# Patient Record
Sex: Male | Born: 1985 | Race: White | Hispanic: No | Marital: Single | Smoking: Current every day smoker
Health system: Southern US, Community
[De-identification: ages and names within clinical notes are randomized; demographics above are authoritative.]

---

## 2013-06-08 ENCOUNTER — Emergency Department (HOSPITAL_COMMUNITY)
Admission: EM | Admit: 2013-06-08 | Discharge: 2013-06-08 | Disposition: A | Discharge status: Home / Self Care | Payer: BC Managed Care – PPO | Attending: Emergency Medicine | Admitting: Emergency Medicine

## 2013-06-08 ENCOUNTER — Emergency Department (HOSPITAL_COMMUNITY): Payer: BC Managed Care – PPO

## 2013-06-08 ENCOUNTER — Encounter (HOSPITAL_COMMUNITY): Payer: Self-pay | Admitting: Emergency Medicine

## 2013-06-08 DIAGNOSIS — Y929 Unspecified place or not applicable: Secondary | ICD-10-CM | POA: Insufficient documentation

## 2013-06-08 DIAGNOSIS — F17200 Nicotine dependence, unspecified, uncomplicated: Secondary | ICD-10-CM | POA: Insufficient documentation

## 2013-06-08 DIAGNOSIS — S5011XA Contusion of right forearm, initial encounter: Secondary | ICD-10-CM

## 2013-06-08 DIAGNOSIS — IMO0002 Reserved for concepts with insufficient information to code with codable children: Secondary | ICD-10-CM | POA: Insufficient documentation

## 2013-06-08 DIAGNOSIS — S62609A Fracture of unspecified phalanx of unspecified finger, initial encounter for closed fracture: Secondary | ICD-10-CM

## 2013-06-08 DIAGNOSIS — S5010XA Contusion of unspecified forearm, initial encounter: Secondary | ICD-10-CM | POA: Insufficient documentation

## 2013-06-08 DIAGNOSIS — Y9389 Activity, other specified: Secondary | ICD-10-CM | POA: Insufficient documentation

## 2013-06-08 MED ORDER — IBUPROFEN 800 MG PO TABS: 800.0000 mg | ORAL_TABLET | Freq: Three times a day (TID) | ORAL | Status: AC
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
## 2013-06-08 NOTE — Discharge Instructions (Signed)
Cryotherapy °Cryotherapy means treatment with cold. Ice or gel packs can be used to reduce both pain and swelling. Ice is the most helpful within the first 24 to 48 hours after an injury or flareup from overusing a muscle or joint. Sprains, strains, spasms, burning pain, shooting pain, and aches can all be eased with ice. Ice can also be used when recovering from surgery. Ice is effective, has very few side effects, and is safe for most people to use. °PRECAUTIONS  °Ice is not a safe treatment option for people with: °· Raynaud's phenomenon. This is a condition affecting small blood vessels in the extremities. Exposure to cold may cause your problems to return. °· Cold hypersensitivity. There are many forms of cold hypersensitivity, including: °· Cold urticaria. Red, itchy hives appear on the skin when the tissues begin to warm after being iced. °· Cold erythema. This is a red, itchy rash caused by exposure to cold. °· Cold hemoglobinuria. Red blood cells break down when the tissues begin to warm after being iced. The hemoglobin that carry oxygen are passed into the urine because they cannot combine with blood proteins fast enough. °· Numbness or altered sensitivity in the area being iced. °If you have any of the following conditions, do not use ice until you have discussed cryotherapy with your caregiver: °· Heart conditions, such as arrhythmia, angina, or chronic heart disease. °· High blood pressure. °· Healing wounds or open skin in the area being iced. °· Current infections. °· Rheumatoid arthritis. °· Poor circulation. °· Diabetes. °Ice slows the blood flow in the region it is applied. This is beneficial when trying to stop inflamed tissues from spreading irritating chemicals to surrounding tissues. However, if you expose your skin to cold temperatures for too long or without the proper protection, you can damage your skin or nerves. Watch for signs of skin damage due to cold. °HOME CARE INSTRUCTIONS °Follow  these tips to use ice and cold packs safely. °· Place a dry or damp towel between the ice and skin. A damp towel will cool the skin more quickly, so you may need to shorten the time that the ice is used. °· For a more rapid response, add gentle compression to the ice. °· Ice for no more than 10 to 20 minutes at a time. The bonier the area you are icing, the less time it will take to get the benefits of ice. °· Check your skin after 5 minutes to make sure there are no signs of a poor response to cold or skin damage. °· Rest 20 minutes or more in between uses. °· Once your skin is numb, you can end your treatment. You can test numbness by very lightly touching your skin. The touch should be so light that you do not see the skin dimple from the pressure of your fingertip. When using ice, most people will feel these normal sensations in this order: cold, burning, aching, and numbness. °· Do not use ice on someone who cannot communicate their responses to pain, such as small children or people with dementia. °HOW TO MAKE AN ICE PACK °Ice packs are the most common way to use ice therapy. Other methods include ice massage, ice baths, and cryo-sprays. Muscle creams that cause a cold, tingly feeling do not offer the same benefits that ice offers and should not be used as a substitute unless recommended by your caregiver. °To make an ice pack, do one of the following: °· Place crushed ice or   a bag of frozen vegetables in a sealable plastic bag. Squeeze out the excess air. Place this bag inside another plastic bag. Slide the bag into a pillowcase or place a damp towel between your skin and the bag.  Mix 3 parts water with 1 part rubbing alcohol. Freeze the mixture in a sealable plastic bag. When you remove the mixture from the freezer, it will be slushy. Squeeze out the excess air. Place this bag inside another plastic bag. Slide the bag into a pillowcase or place a damp towel between your skin and the bag. SEEK MEDICAL  CARE IF:  You develop white spots on your skin. This may give the skin a blotchy (mottled) appearance.  Your skin turns blue or pale.  Your skin becomes waxy or hard.  Your swelling gets worse. MAKE SURE YOU:   Understand these instructions.  Will watch your condition.  Will get help right away if you are not doing well or get worse. Document Released: 10/17/2010 Document Revised: 05/15/2011 Document Reviewed: 10/17/2010 [facility] Patient Information 2014 [employer], [place]. Finger Fracture Fractures of fingers are breaks in the bones of the fingers. There are many types of fractures. There are different ways of treating these fractures. Your health care provider will discuss the best way to treat your fracture. CAUSES Traumatic injury is the main cause of broken fingers. These include:  Injuries while playing sports.  Workplace injuries.  Falls. RISK FACTORS Activities that can increase your risk of finger fractures include:  Sports.  Workplace activities that involve machinery.  A condition called osteoporosis, which can make your bones less dense and cause them to fracture more easily. SIGNS AND SYMPTOMS The main symptoms of a broken finger are pain and swelling within 15 minutes after the injury. Other symptoms include:  Bruising of your finger.  Stiffness of your finger.  Numbness of your finger.  Exposed bones (compound fracture) if the fracture is severe. DIAGNOSIS  The best way to diagnose a broken bone is with X-ray imaging. Additionally, your health care provider will use this X-ray image to evaluate the position of the broken finger bones.  TREATMENT  Finger fractures can be treated with:   Nonreduction This means the bones are in place. The finger is splinted without changing the positions of the bone pieces. The splint is usually left on for about a week to 10 days. This will depend on your fracture and what your health care provider thinks.  Closed  reduction The bones are put back into position without using surgery. The finger is then splinted.  Open reduction and internal fixation The fracture site is opened. Then the bone pieces are fixed into place with pins or some type of hardware. This is seldom required. It depends on the severity of the fracture. HOME CARE INSTRUCTIONS   Follow your health care provider's instructions regarding activities, exercises, and physical therapy.  Only take over-the-counter or prescription medicines for pain, discomfort, or fever as directed by your health care provider. SEEK MEDICAL CARE IF: You have pain or swelling that limits the motion or use of your fingers. SEEK IMMEDIATE MEDICAL CARE IF:  Your finger becomes numb. MAKE SURE YOU:   Understand these instructions.  Will watch your condition.  Will get help right away if you are not doing well or get worse. Document Released: 06/04/2000 Document Revised: 12/11/2012 Document Reviewed: 10/02/2012 [facility] Patient Information 2014 [place], [place].
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
## 2013-06-08 NOTE — ED Notes (Signed)
Called ortho tech for splint 

## 2013-06-08 NOTE — Progress Notes (Signed)
Orthopedic Tech Progress Note Patient Details:  Paul Castillo 03/26/1985 [id number]  Ortho Devices Type of Ortho Device: Finger splint Ortho Device/Splint Location: RUE Ortho Device/Splint Interventions: Ordered;Application   Jennye MoccasinHughes, Keyonta Barradas Craig 06/08/2013, 8:04 PM
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
## 2013-06-08 NOTE — ED Notes (Signed)
Patient transported to X-ray 

## 2013-06-10 NOTE — ED Provider Notes (Signed)
Medical screening examination/treatment/procedure(s) were performed by non-physician practitioner and as supervising physician I was immediately available for consultation/collaboration.   Candyce ChurnJohn David Suhan Paci III, MD 06/10/13 [phone]

## 2014-11-18 IMAGING — CR DG HAND COMPLETE 3+V*R*
3 series · 3 of 3 positions shown · non-contrast
Comparison: None.

CLINICAL DATA: Trauma.

EXAM:
RIGHT HAND - COMPLETE 3+ VIEW

[x hand pa right]
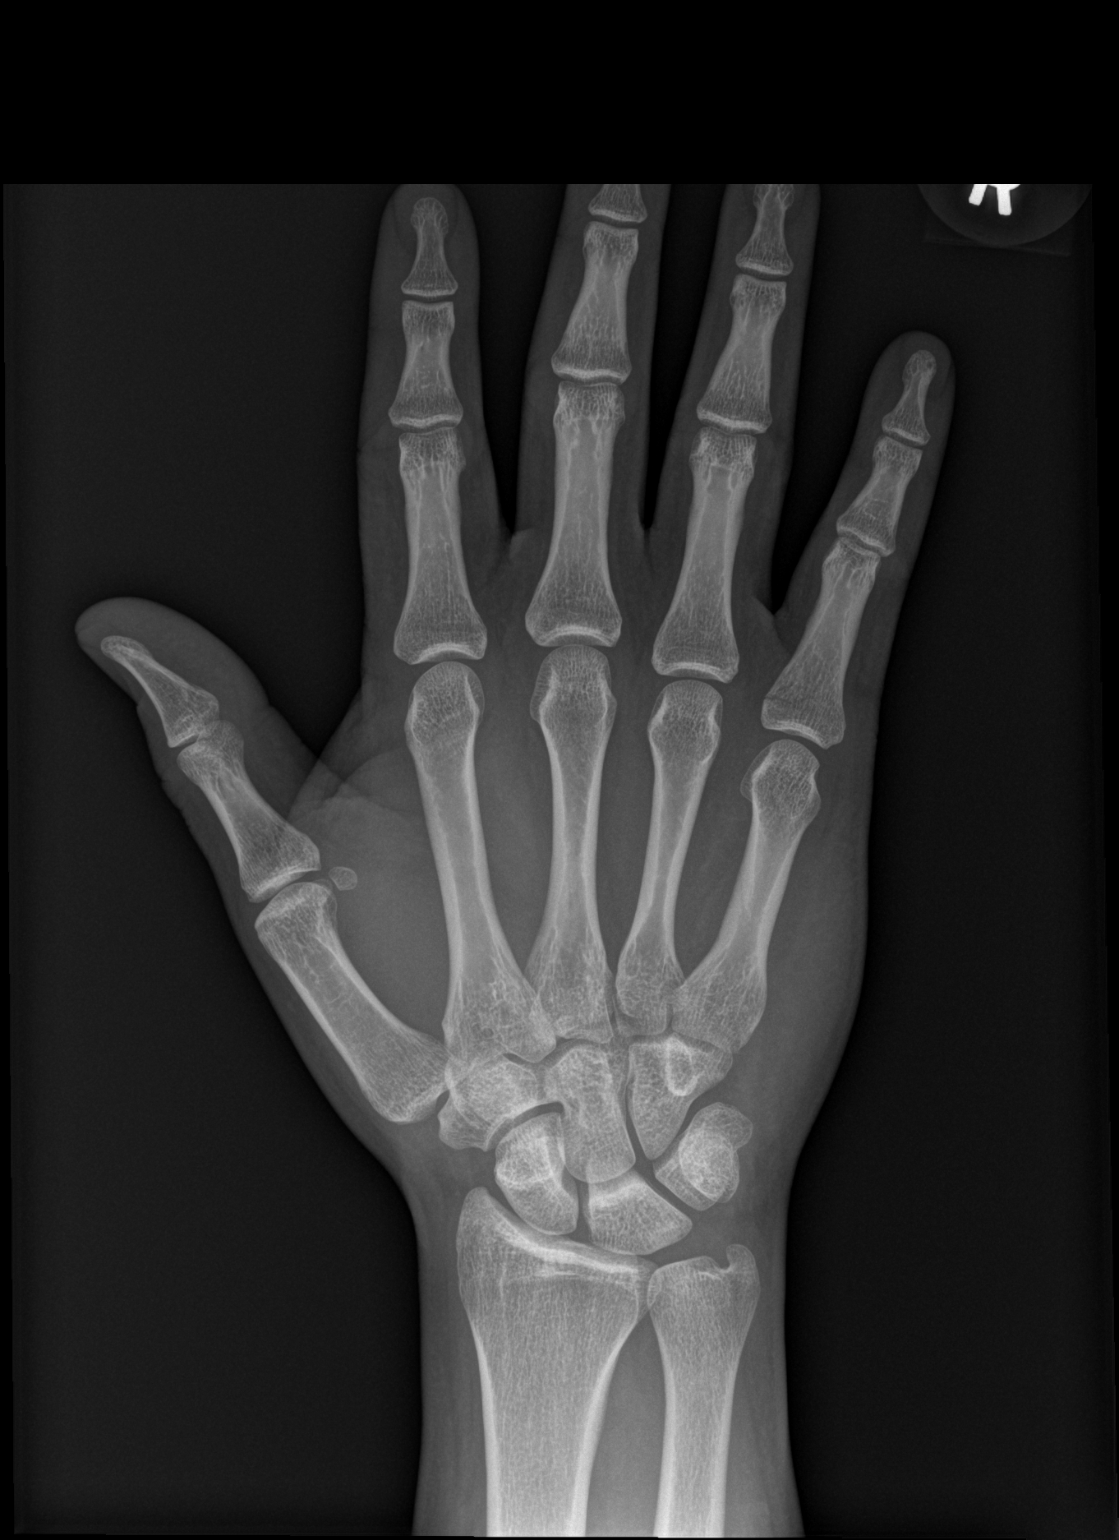

[x hand obl right]
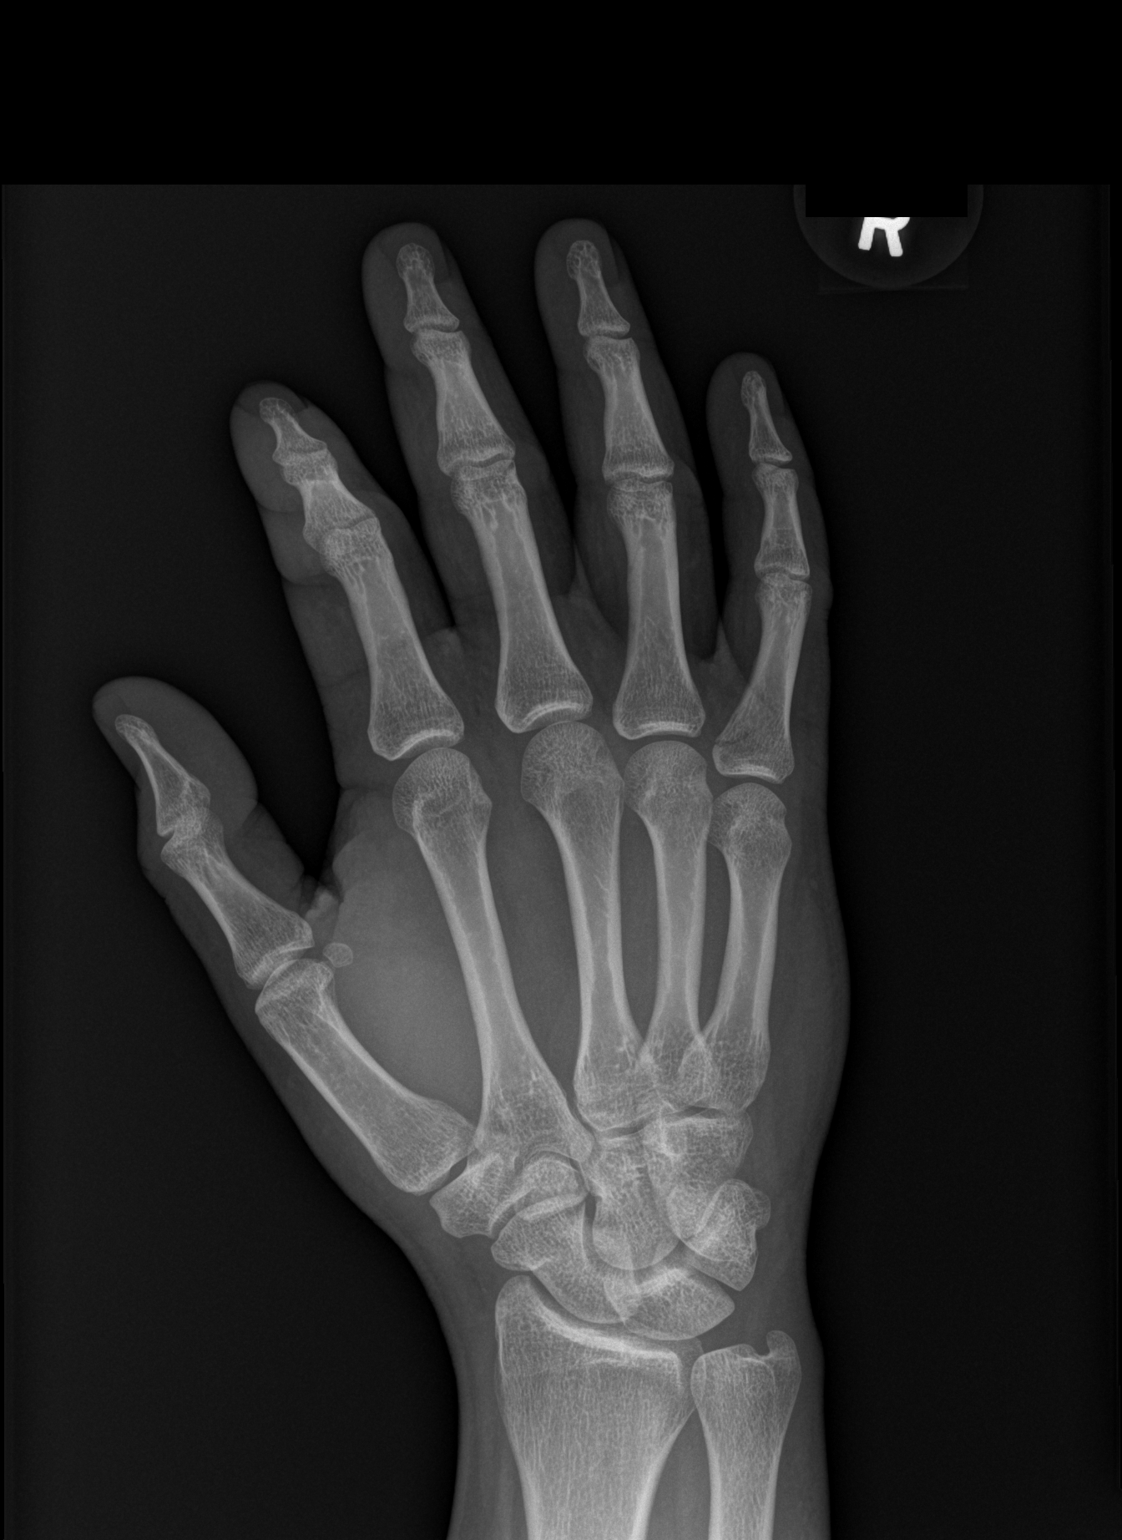

[x hand lat right]
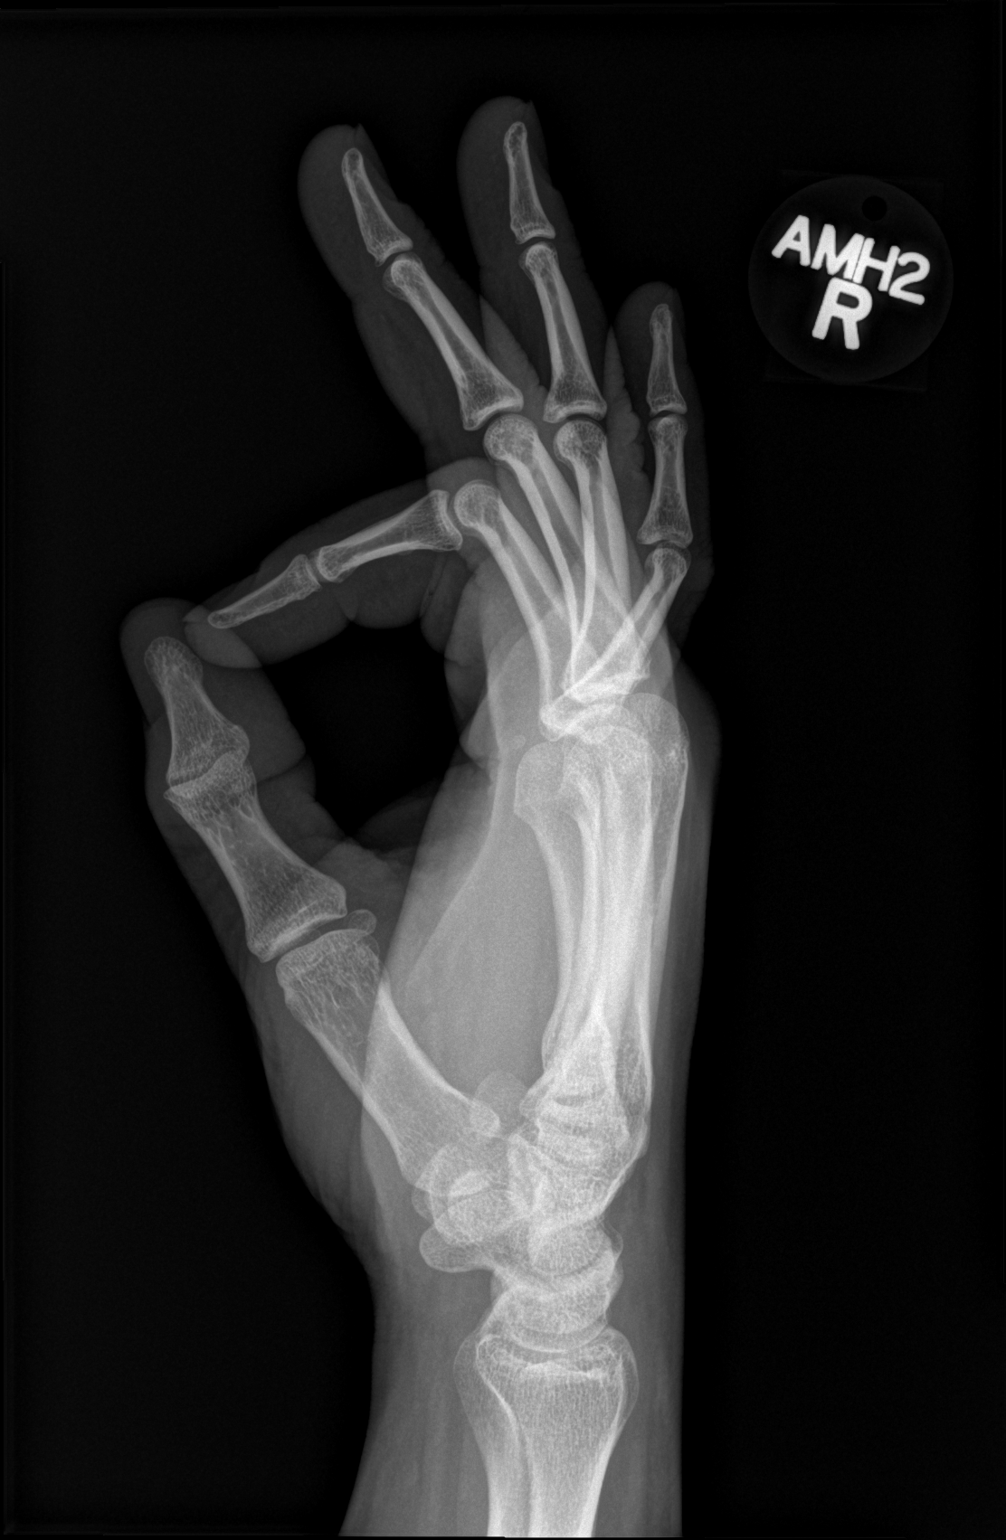

[3 of 3 positions shown; findings below may reference images not displayed]

FINDINGS: The joint spaces are maintained. There is a nondisplaced fracture
involving the base of the fifth proximal phalanx. No intra-articular
involvement is identified. The metacarpal bones are intact.
IMPRESSION: Nondisplaced transverse fracture involving the base of the fifth
proximal phalanx.
